# Patient Record
Sex: Male | Born: 1995 | Race: White | Hispanic: No | Marital: Single | Smoking: Never smoker
Health system: Southern US, Community
[De-identification: ages and names within clinical notes are randomized; demographics above are authoritative.]

---
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
## 2021-08-29 ENCOUNTER — Emergency Department (HOSPITAL_COMMUNITY)
Admission: EM | Admit: 2021-08-29 | Discharge: 2021-08-29 | Disposition: A | Discharge status: Home / Self Care | Payer: Self-pay | Attending: Emergency Medicine | Admitting: Emergency Medicine

## 2021-08-29 ENCOUNTER — Other Ambulatory Visit: Payer: Self-pay

## 2021-08-29 ENCOUNTER — Encounter (HOSPITAL_COMMUNITY): Payer: Self-pay | Admitting: Emergency Medicine

## 2021-08-29 DIAGNOSIS — X58XXXA Exposure to other specified factors, initial encounter: Secondary | ICD-10-CM | POA: Insufficient documentation

## 2021-08-29 DIAGNOSIS — T1501XA Foreign body in cornea, right eye, initial encounter: Secondary | ICD-10-CM | POA: Insufficient documentation

## 2021-08-29 MED ORDER — ERYTHROMYCIN 5 MG/GM OP OINT: TOPICAL_OINTMENT | OPHTHALMIC | 0 refills | Status: AC

## 2021-08-29 MED ORDER — FLUORESCEIN SODIUM 1 MG OP STRP
1.0000 | ORAL_STRIP | Freq: Once | OPHTHALMIC | Status: AC
  Administered 2021-08-29: 1 via OPHTHALMIC
  Filled 2021-08-29: qty 1

## 2021-08-29 MED ORDER — TETRACAINE HCL 0.5 % OP SOLN
1.0000 [drp] | Freq: Once | OPHTHALMIC | Status: AC
  Administered 2021-08-29: 1 [drp] via OPHTHALMIC
  Filled 2021-08-29: qty 4

## 2021-08-29 MED ORDER — ERYTHROMYCIN 5 MG/GM OP OINT: 1.0000 "application " | TOPICAL_OINTMENT | Freq: Once | OPHTHALMIC | Status: DC

## 2021-08-29 NOTE — Discharge Instructions (Signed)
You have been seen and discharged from the emergency department. You have a foreign body of the right cornea. Use antibiotic ointment as directed. Call Dr. Ladona Mow office Monday morning for appointment for removal. Take Ibuprofen as needed for pain control. If you have any worsening symptoms, severe eye pain, vision loss or further concerns for your health please return to an emergency department for further evaluation.

## 2021-08-29 NOTE — ED Provider Notes (Signed)
Emergency Medicine Provider Triage Evaluation Note  Mark Coleman , a 25 y.o. male  was evaluated in triage.  Pt complains of foreign body sensation right eye.  Noticed yesterday but worsened this morning.  He tried to flush the eye with saline with only minimal improvement.  He became concerned when his wife said she saw a speck in his eye.  Denies any visual disturbance.  Reports clear drainage and irritation but no pain with EOMs or trouble moving his eye.  Review of Systems  Positive: Right eye irritation Negative: Proptosis  Physical Exam  BP 124/78 (BP Location: Right Arm)   Pulse 85   Temp (!) 97.5 F (36.4 C) (Oral)   Resp 16   SpO2 100%  Gen:   Awake, no distress   Resp:  Normal effort  MSK:   Moves extremities without difficulty  Other:  Small speck of foreign body in 7 o'clock position on R iris  Medical Decision Making  Medically screening exam initiated at 10:36 AM.  Appropriate orders placed.  Mark Coleman was informed that the remainder of the evaluation will be completed by another provider, this initial triage assessment does not replace that evaluation, and the importance of remaining in the ED until their evaluation is complete.  Will likely need irrigation and evaluation for abrasion   Dietrich Pates, PA-C 08/29/21 1037    Horton, Clabe Seal, DO 08/31/21 5638

## 2021-08-29 NOTE — ED Provider Notes (Signed)
[facility] EMERGENCY DEPARTMENT Provider Note   CSN: [id number] Arrival date & time: 08/29/21  1013     History No chief complaint on file.   JASSIEL FLYE is a 25 y.o. male.  HPI   25 year old male presents with right eye pain. 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 Has been having scratchy like discomfort in the right eye with clear tearing. No vision change or loss. No pain with EOM. No fever or trauma to the eye otherwise.   History reviewed. No pertinent past medical history.  There are no problems to display for this patient.   History reviewed. No pertinent surgical history.     No family history on file.  Social History   Tobacco Use   Smoking status: Never   Smokeless tobacco: Never  Substance Use Topics   Alcohol use: Not Currently   Drug use: Not Currently    Home Medications Prior to Admission medications   Medication Sig Start Date End Date Taking? Authorizing Provider  erythromycin ophthalmic ointment Place a 1/2 inch ribbon of ointment into the lower eyelid. 08/29/21  Yes Jace Fermin, Clabe Seal, DO    Allergies    Patient has no allergy information on record.  Review of Systems   Review of Systems  Constitutional:  Negative for fever.  HENT:  Negative for congestion and nosebleeds.   Eyes:  Positive for pain and redness. Negative for visual disturbance.  Respiratory:  Negative for shortness of breath.   Cardiovascular:  Negative for chest pain.  Gastrointestinal:  Negative for abdominal pain.  Skin:  Negative for rash.  Neurological:  Negative for headaches.   Physical Exam Updated Vital Signs BP 124/78 (BP Location: Right Arm)   Pulse 85   Temp (!) 97.5 F (36.4 C) (Oral)   Resp 16   SpO2 100%   Physical Exam Vitals and nursing note reviewed.  Constitutional:      Appearance: Normal appearance.  HENT:     Head: Normocephalic.     Mouth/Throat:     Mouth: Mucous membranes are moist.   Eyes:     General:        Right eye: No discharge.     Extraocular Movements: Extraocular movements intact.     Pupils: Pupils are equal, round, and reactive to light.     Comments: Right eye scleral injection, equal and reactive pupil, no pain with EOM, acuity in tact, fluorescein: small metallic appearing FB about 7 o clock in the right iris, no associated ulcer or abrasion  Cardiovascular:     Rate and Rhythm: Normal rate.  Pulmonary:     Effort: Pulmonary effort is normal. No respiratory distress.  Abdominal:     Palpations: Abdomen is soft.     Tenderness: There is no abdominal tenderness.  Skin:    General: Skin is warm.  Neurological:     Mental Status: He is alert and oriented to person, place, and time. Mental status is at baseline.  Psychiatric:        Mood and Affect: Mood normal.    ED Results / Procedures / Treatments   Labs (all labs ordered are listed, but only abnormal results are displayed) Labs Reviewed - No data to display  EKG None  Radiology No results found.  Procedures Procedures   Medications Ordered in ED Medications  erythromycin ophthalmic ointment 1 application (has no administration in time range)  fluorescein  ophthalmic strip 1 strip (1 strip Right Eye Given 08/29/21 1210)  tetracaine (PONTOCAINE) 0.5 % ophthalmic solution 1 drop (1 drop Right Eye Given 08/29/21 1211)    ED Course  I have reviewed the triage vital signs and the nursing notes.  Pertinent labs & imaging results that were available during my care of the patient were reviewed by me and considered in my medical decision making (see chart for details).    MDM Rules/Calculators/A&P                           25 yo male presents with right eye irritation. VSS, neuro in tact, visual acuity baseline. Right eye has a small metallic FB without associated abrasion or ulcer. Not removable with cotton tip. Spoke with Dr. Jenene Slicker who can see the patient Monday morning for removal.  Patient will use erythromycin ointment and OTC for pain control and follow up on Monday.  Final Clinical Impression(s) / ED Diagnoses Final diagnoses:  Foreign body of right cornea, initial encounter    Rx / DC Orders ED Discharge Orders          Ordered    erythromycin ophthalmic ointment        08/29/21 1354             Zenda Herskowitz, Clabe Seal, DO 08/29/21 1406

## 2021-08-29 NOTE — ED Triage Notes (Signed)
C/o foreign object in R eye since yesterday.  Pain and redness.

## 2024-12-08 ENCOUNTER — Ambulatory Visit: Admission: EM | Admit: 2024-12-08 | Discharge: 2024-12-08 | Disposition: A | Discharge status: Home / Self Care

## 2024-12-08 DIAGNOSIS — M62830 Muscle spasm of back: Secondary | ICD-10-CM | POA: Diagnosis not present

## 2024-12-08 MED ORDER — METHOCARBAMOL 750 MG PO TABS: 750.0000 mg | ORAL_TABLET | Freq: Four times a day (QID) | ORAL | 0 refills | Status: AC | PRN

## 2024-12-08 MED ORDER — IBUPROFEN 600 MG PO TABS: 600.0000 mg | ORAL_TABLET | Freq: Four times a day (QID) | ORAL | 0 refills | Status: AC | PRN

## 2024-12-08 NOTE — Discharge Instructions (Signed)
 Neck / Trapezius Muscle Spasm  What this is A neck or trapezius muscle spasm is a sudden tightening of the muscles in the neck and upper back. This commonly occurs from poor posture, stress, overuse, awkward sleeping positions, or minor strain.  Common symptoms - Neck or upper shoulder pain and tightness - Muscle stiffness or spasm - Pain worse with movement or turning the head - Headache or upper back discomfort  Expected recovery - Symptoms often improve within a few days - Most resolve within 1-2 weeks with conservative treatment - Gradual return of normal movement is expected  Treatment Plan  Ibuprofen  600 mg -- every 6 hours - Why it works: Reduces inflammation and relieves pain - How to take: Take with food to protect your stomach - Common side effects: Upset stomach, heartburn - Avoid taking other NSAIDs at the same time  Methocarbamol  750 mg -- up to four times daily - Why it works: Relaxes tight muscles and reduces spasms - How to take: Use as prescribed; may take as needed - Common side effects: Drowsiness, dizziness - Avoid driving or operating machinery until you know how it affects you  Symptom Management at Home - Heat therapy to the neck/upper back for 15-20 minutes, several times daily - Topical treatments (Icy Hot, Biofreeze, Voltaren gel) as needed - Gentle neck stretching once pain allows - Maintain good posture; avoid prolonged phone or computer use - Support neck with a proper pillow during sleep  When to Seek Medical Care - Pain not improving after 7-10 days - Worsening stiffness or pain - New numbness, tingling, or weakness in the arms - Pain interfering with sleep or daily activities  When to Go to the Emergency Department  - Severe pain after trauma or injury - Weakness or numbness in the arms or hands - Loss of bowel or bladder control - Fever with neck stiffness

## 2024-12-08 NOTE — ED Provider Notes (Signed)
 " [facility]    CSN: [id number] Arrival date & time: 12/08/24  1059      History   Chief Complaint Chief Complaint  Patient presents with   Torticollis    HPI Mark Coleman is a 29 y.o. male.   Kiet presents today with complaint of left-sided neck pain that began 2 weeks ago, and has mildly improved since onset.  He denies known injury, but reports that neck pain started when he stepped wrong, and felt a jolt of his body.  Since this time he has had constant discomfort in the left side neck that is made worse with neck extension and flexion.  He denies pain with neck rotation and shoulder range of motion.  Denies numbness, tingling, weakness of upper extremities as well as headache, blurred vision, and dizziness.  He has been attempting to treat with Tylenol, heat, and Tiger balm without any significant relief.  The history is provided by the patient.    History reviewed. No pertinent past medical history.  There are no active problems to display for this patient.   History reviewed. No pertinent surgical history.     Home Medications    Prior to Admission medications  Medication Sig Start Date End Date Taking? Authorizing Provider  ibuprofen  (ADVIL ) 600 MG tablet Take 1 tablet (600 mg total) by mouth every 6 (six) hours as needed. 12/08/24  Yes Leatrice Vernell HERO, NP  methocarbamol  (ROBAXIN ) 750 MG tablet Take 1 tablet (750 mg total) by mouth 4 (four) times daily as needed for muscle spasms. 12/08/24  Yes Leatrice Vernell HERO, NP  erythromycin  ophthalmic ointment Place a 1/2 inch ribbon of ointment into the lower eyelid. 08/29/21   Horton, Kristie M, DO    Family History No family history on file.  Social History Social History[1]   Allergies   Patient has no allergy information on record.   Review of Systems Review of Systems  Constitutional: Negative.   Musculoskeletal:  Positive for myalgias, neck pain and neck stiffness. Negative for  arthralgias, back pain, gait problem and joint swelling.  Neurological:  Negative for dizziness, weakness, light-headedness, numbness and headaches.     Physical Exam Triage Vital Signs ED Triage Vitals  Encounter Vitals Group     BP 12/08/24 1116 122/79     Girls Systolic BP Percentile --      Girls Diastolic BP Percentile --      Boys Systolic BP Percentile --      Boys Diastolic BP Percentile --      Pulse Rate 12/08/24 1116 84     Resp 12/08/24 1116 15     Temp 12/08/24 1116 97.9 F (36.6 C)     Temp Source 12/08/24 1116 Oral     SpO2 12/08/24 1116 98 %     Weight 12/08/24 1116 160 lb (72.6 kg)     Height 12/08/24 1116 5' 9 (1.753 m)     Head Circumference --      Peak Flow --      Pain Score 12/08/24 1114 4     Pain Loc --      Pain Education --      Exclude from Growth Chart --    No data found.  Updated Vital Signs BP 122/79 (BP Location: Left Arm)   Pulse 84   Temp 97.9 F (36.6 C) (Oral)   Resp 15   Ht 5' 9 (1.753 m)   Wt 160 lb (72.6 kg)  SpO2 98%   BMI 23.63 kg/m   Visual Acuity Right Eye Distance:   Left Eye Distance:   Bilateral Distance:    Right Eye Near:   Left Eye Near:    Bilateral Near:     Physical Exam Vitals and nursing note reviewed.  Constitutional:      General: He is not in acute distress.    Appearance: Normal appearance. He is normal weight. He is not toxic-appearing.  HENT:     Mouth/Throat:     Tonsils: 1+ on the right. 1+ on the left.  Eyes:     Conjunctiva/sclera: Conjunctivae normal.  Cardiovascular:     Rate and Rhythm: Normal rate and regular rhythm.     Heart sounds: Normal heart sounds.  Pulmonary:     Effort: Pulmonary effort is normal.     Breath sounds: Normal breath sounds and air entry.  Musculoskeletal:     Left shoulder: Tenderness (Trapezius muscle, superior aspect) present. No swelling, deformity, effusion, laceration, bony tenderness or crepitus. Normal range of motion. Normal strength. Normal  pulse.     Cervical back: Neck supple. Spasms (Left-sided) and tenderness (Left side) present. No swelling, edema, deformity, erythema, signs of trauma, lacerations, rigidity, torticollis, bony tenderness or crepitus. Pain with movement (Flexion and extension) and muscular tenderness present. No spinous process tenderness. Decreased range of motion (Flexion and extension secondary to pain).     Thoracic back: Normal.     Lumbar back: No spasms, tenderness or bony tenderness. Normal range of motion. Negative right straight leg raise test and negative left straight leg raise test.  Lymphadenopathy:     Cervical:     Right cervical: No posterior cervical adenopathy.    Left cervical: No posterior cervical adenopathy.  Skin:    General: Skin is warm and dry.  Neurological:     Mental Status: He is alert and oriented to person, place, and time.  Psychiatric:        Mood and Affect: Mood normal.        Behavior: Behavior normal.      UC Treatments / Results  Labs (all labs ordered are listed, but only abnormal results are displayed) Labs Reviewed - No data to display  EKG   Radiology No results found.  Procedures Procedures (including critical care time)  Medications Ordered in UC Medications - No data to display  Initial Impression / Assessment and Plan / UC Course  I have reviewed the triage vital signs and the nursing notes.  Pertinent labs & imaging results that were available during my care of the patient were reviewed by me and considered in my medical decision making (see chart for details).     Muscle spasm Low concern for spinous process/fracture as there is no bony tenderness on examination.  No findings of radiculopathy-patient neurovascularly intact.  Tenderness and spasm noted on left superior trapezius muscle and left cervical paraspinals.  Sternocleidomastoid not tender to palpation.  Range of motion neck intact despite pain.  Advised patient to discontinue  Tylenol and take ibuprofen  600 mg every 6 hours.  He may continue heat and Tiger balm to the area.  Prescription for methocarbamol  750 mg 4 times daily prescribed for muscle tightness.  Advised to continue normal activity and gentle stretching as tolerated.  Seek medical care for numbness, tingling, or weakness of upper extremities,  Sudden increase in neck range of motion, or new onset neurological symptoms including headaches, blurred vision, or dizziness Final Clinical Impressions(s) /  UC Diagnoses   Final diagnoses:  Spasm of left trapezius muscle     Discharge Instructions      Neck / Trapezius Muscle Spasm  What this is A neck or trapezius muscle spasm is a sudden tightening of the muscles in the neck and upper back. This commonly occurs from poor posture, stress, overuse, awkward sleeping positions, or minor strain.  Common symptoms - Neck or upper shoulder pain and tightness - Muscle stiffness or spasm - Pain worse with movement or turning the head - Headache or upper back discomfort  Expected recovery - Symptoms often improve within a few days - Most resolve within 1-2 weeks with conservative treatment - Gradual return of normal movement is expected  Treatment Plan  Ibuprofen  600 mg -- every 6 hours - Why it works: Reduces inflammation and relieves pain - How to take: Take with food to protect your stomach - Common side effects: Upset stomach, heartburn - Avoid taking other NSAIDs at the same time  Methocarbamol  750 mg -- up to four times daily - Why it works: Relaxes tight muscles and reduces spasms - How to take: Use as prescribed; may take as needed - Common side effects: Drowsiness, dizziness - Avoid driving or operating machinery until you know how it affects you  Symptom Management at Home - Heat therapy to the neck/upper back for 15-20 minutes, several times daily - Topical treatments (Icy Hot, Biofreeze, Voltaren gel) as needed - Gentle neck stretching  once pain allows - Maintain good posture; avoid prolonged phone or computer use - Support neck with a proper pillow during sleep  When to Seek Medical Care - Pain not improving after 7-10 days - Worsening stiffness or pain - New numbness, tingling, or weakness in the arms - Pain interfering with sleep or daily activities  When to Go to the Emergency Department  - Severe pain after trauma or injury - Weakness or numbness in the arms or hands - Loss of bowel or bladder control - Fever with neck stiffness        ED Prescriptions     Medication Sig Dispense Auth. Provider   ibuprofen  (ADVIL ) 600 MG tablet Take 1 tablet (600 mg total) by mouth every 6 (six) hours as needed. 30 tablet Leatrice Vernell HERO, NP   methocarbamol  (ROBAXIN ) 750 MG tablet Take 1 tablet (750 mg total) by mouth 4 (four) times daily as needed for muscle spasms. 28 tablet Leatrice Vernell HERO, NP      PDMP not reviewed this encounter.    [1]  Social History Tobacco Use   Smoking status: Never   Smokeless tobacco: Current    Types: Snuff  Substance Use Topics   Alcohol use: Yes    Comment: social   Drug use: Never     Leatrice Vernell HERO, NP 12/08/24 1215  "

## 2024-12-08 NOTE — ED Triage Notes (Addendum)
 Ryoma presents with neck pain x 2 weeks. It feels like a crook in my neck, it doesn't normally last this long.I remember waking up the Sunday before Christmas, waking up fine, I took a step down onto the ground, I felt a shock in my back and it has been stiff since. Treated with stretching, tiger bomb and Tylenol without relief.
